# Patient Record
Sex: Male | Born: 1995 | Race: White | Hispanic: No | Marital: Single | Smoking: Never smoker
Health system: Southern US, Community
[De-identification: ages and names within clinical notes are randomized; demographics above are authoritative.]

## PROBLEM LIST (undated history)

## (undated) DIAGNOSIS — N200 Calculus of kidney: Secondary | ICD-10-CM

## (undated) DIAGNOSIS — T7840XA Allergy, unspecified, initial encounter: Secondary | ICD-10-CM

## (undated) HISTORY — DX: Allergy, unspecified, initial encounter: T78.40XA

## (undated) HISTORY — DX: Calculus of kidney: N20.0

---
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
## 2016-05-10 ENCOUNTER — Emergency Department (HOSPITAL_COMMUNITY): Payer: BLUE CROSS/BLUE SHIELD

## 2016-05-10 ENCOUNTER — Encounter (HOSPITAL_COMMUNITY): Payer: Self-pay

## 2016-05-10 ENCOUNTER — Emergency Department (HOSPITAL_COMMUNITY)
Admission: EM | Admit: 2016-05-10 | Discharge: 2016-05-10 | Disposition: A | Discharge status: Home / Self Care | Payer: BLUE CROSS/BLUE SHIELD | Attending: Emergency Medicine | Admitting: Emergency Medicine

## 2016-05-10 DIAGNOSIS — N201 Calculus of ureter: Secondary | ICD-10-CM | POA: Insufficient documentation

## 2016-05-10 DIAGNOSIS — N50811 Right testicular pain: Secondary | ICD-10-CM | POA: Diagnosis present

## 2016-05-10 LAB — URINALYSIS, ROUTINE W REFLEX MICROSCOPIC
BILIRUBIN URINE: NEGATIVE
Bacteria, UA: NONE SEEN
GLUCOSE, UA: NEGATIVE mg/dL
Ketones, ur: NEGATIVE mg/dL
Leukocytes, UA: NEGATIVE
NITRITE: NEGATIVE
PROTEIN: 30 mg/dL — AB
SPECIFIC GRAVITY, URINE: 1.027 (ref 1.005–1.030)
pH: 6 (ref 5.0–8.0)

## 2016-05-10 MED ORDER — ONDANSETRON HCL 4 MG/2ML IJ SOLN
4.0000 mg | Freq: Once | INTRAMUSCULAR | Status: AC
  Administered 2016-05-10: 4 mg via INTRAVENOUS
  Filled 2016-05-10: qty 2

## 2016-05-10 MED ORDER — FENTANYL CITRATE (PF) 100 MCG/2ML IJ SOLN
50.0000 ug | Freq: Once | INTRAMUSCULAR | Status: AC
  Administered 2016-05-10: 50 ug via INTRAVENOUS
  Filled 2016-05-10: qty 2

## 2016-05-10 MED ORDER — TAMSULOSIN HCL 0.4 MG PO CAPS: ORAL_CAPSULE | ORAL | 0 refills | Status: AC

## 2016-05-10 MED ORDER — OXYCODONE-ACETAMINOPHEN 5-325 MG PO TABS: 1.0000 | ORAL_TABLET | Freq: Four times a day (QID) | ORAL | 0 refills | Status: AC | PRN

## 2016-05-10 MED ORDER — ONDANSETRON HCL 4 MG PO TABS: 4.0000 mg | ORAL_TABLET | Freq: Three times a day (TID) | ORAL | 0 refills | Status: AC | PRN

## 2016-05-10 MED ORDER — KETOROLAC TROMETHAMINE 30 MG/ML IJ SOLN
30.0000 mg | Freq: Once | INTRAMUSCULAR | Status: AC
Start: 2016-05-10 — End: 2016-05-10
  Administered 2016-05-10: 30 mg via INTRAVENOUS
  Filled 2016-05-10: qty 1

## 2016-05-10 NOTE — ED Provider Notes (Signed)
[facility] DE[facility] Provider Note   CSN: [id number] Arrival date & time: 05/10/16  0435  Time seen 04:48 AM   History   Chief Complaint Chief Complaint  Patient presents with  . Testicle Pain    HPI Erik Phillips is a 21 y.o. male.  HPI  patient states he developed acute onset of pain in his right lower quadrant and right testicle about 2:15 this morning. The pain gets better or worse but does not go away. He states laying down makes the pain worse, standing up makes it feel better. The pain does not radiate into his back. He denies any swelling in his testicle. He denies his testicle being painful to touch. He's had no difficulty urinating. He's never had this pain before. Mother states she and her father has kidney stones. Patient has had nausea and vomiting because of the pain. Mother gave him 2 Pepto-Bismol without relief.  [facility]   History reviewed. No pertinent past medical history.  There are no active problems to display for this patient.   History reviewed. No pertinent surgical history.     Home Medications    Prior to Admission medications   Medication Sig Start Date End Date Taking? Authorizing Provider  ondansetron (ZOFRAN) 4 MG tablet Take 1 tablet (4 mg total) by mouth every 8 (eight) hours as needed for nausea or vomiting. 05/10/16   Devoria AlbeIva Livi Mcgann, MD  oxyCODONE-acetaminophen (PERCOCET/ROXICET) 5-325 MG tablet Take 1 tablet by mouth every 6 (six) hours as needed for severe pain. 05/10/16   Devoria AlbeIva Abie Killian, MD  tamsulosin (FLOMAX) 0.4 MG CAPS capsule Take 1 po QD until you pass the stone. 05/10/16   Devoria AlbeIva Latoiya Maradiaga, MD    Family History No family history on file.  Social History Social History  Substance Use Topics  . Smoking status: Never Smoker  . Smokeless tobacco: Never Used  . Alcohol use No  lives with mother  Allergies   Patient has no known allergies.   Review of Systems Review of Systems  All other systems reviewed and are  negative.    Physical Exam Updated Vital Signs BP 141/80 (BP Location: Left Arm)   Pulse 80   Temp 98.9 F (37.2 C) (Oral)   Resp 20   Ht 5\' 9"  (1.753 m)   Wt 190 lb (86.2 kg)   SpO2 99%   BMI 28.06 kg/m   Vital signs normal    Physical Exam  Constitutional: He is oriented to person, place, and time. He appears well-developed and well-nourished.  Non-toxic appearance. He does not appear ill. He appears distressed.  Pacing in room  HENT:  Head: Normocephalic and atraumatic.  Right Ear: External ear normal.  Left Ear: External ear normal.  Nose: Nose normal. No mucosal edema or rhinorrhea.  Mouth/Throat: Oropharynx is clear and moist and mucous membranes are normal. No dental abscesses or uvula swelling.  Eyes: Conjunctivae and EOM are normal. Pupils are equal, round, and reactive to light.  Neck: Normal range of motion and full passive range of motion without pain. Neck supple.  Cardiovascular: Normal rate, regular rhythm and normal heart sounds.  Exam reveals no gallop and no friction rub.   No murmur heard. Pulmonary/Chest: Effort normal and breath sounds normal. No respiratory distress. He has no wheezes. He has no rhonchi. He has no rales. He exhibits no tenderness and no crepitus.  Abdominal: Soft. Normal appearance and bowel sounds are normal. He exhibits no distension. There is tenderness in  the right lower quadrant. There is no rebound and no guarding.    Genitourinary: Penis normal.  Genitourinary Comments: Patient's testicles are normal size bilaterally. Neither testicle is tender to palpation. The scrotum does not appear enlarged. He's nontender over the epididymis. No CVA tenderness bilaterally.  Musculoskeletal: Normal range of motion. He exhibits no edema or tenderness.  Moves all extremities well.   Neurological: He is alert and oriented to person, place, and time. He has normal strength. No cranial nerve deficit.  Skin: Skin is warm, dry and intact. No rash  noted. No erythema. There is pallor.  Psychiatric: He has a normal mood and affect. His speech is normal and behavior is normal. His mood appears not anxious.  Nursing note and vitals reviewed.    ED Treatments / Results  Labs (all labs ordered are listed, but only abnormal results are displayed)  Results for orders placed or performed during the hospital encounter of 05/10/16  Urinalysis, Routine w reflex microscopic  Result Value Ref Range   Color, Urine YELLOW YELLOW   APPearance HAZY (A) CLEAR   Specific Gravity, Urine 1.027 1.005 - 1.030   pH 6.0 5.0 - 8.0   Glucose, UA NEGATIVE NEGATIVE mg/dL   Hgb urine dipstick LARGE (A) NEGATIVE   Bilirubin Urine NEGATIVE NEGATIVE   Ketones, ur NEGATIVE NEGATIVE mg/dL   Protein, ur 30 (A) NEGATIVE mg/dL   Nitrite NEGATIVE NEGATIVE   Leukocytes, UA NEGATIVE NEGATIVE   RBC / HPF TOO NUMEROUS TO COUNT 0 - 5 RBC/hpf   WBC, UA 0-5 0 - 5 WBC/hpf   Bacteria, UA NONE SEEN NONE SEEN   Mucous PRESENT      Laboratory interpretation all normal except hematuria    EKG  EKG Interpretation None       Radiology Ct Renal Stone Study  Result Date: 05/10/2016 CLINICAL DATA:  Left testicular pain.  Right lower abdominal pain. EXAM: CT ABDOMEN AND PELVIS WITHOUT CONTRAST TECHNIQUE: Multidetector CT imaging of the abdomen and pelvis was performed following the standard protocol without IV contrast. COMPARISON:  None. FINDINGS: Lower chest: No pulmonary nodules or pleural effusion. No visible pericardial effusion. Hepatobiliary: Normal noncontrast appearance of the liver. No visible biliary dilatation. Normal gallbladder. Pancreas: Normal noncontrast appearance of the pancreas. No peripancreatic fluid collection. Spleen: Normal. Adrenal glands: Normal. Urinary Tract: --Right kidney/ureter: There is mild right hydronephrosis and hydroureter. There is a 3 x 3 mm ureteral calculus at the level of the pelvic brim. There is an additional punctate renal  calculus near the lower pole the right kidney. --Left kidney/ureter: No hydronephrosis or perinephric stranding. No nephrolithiasis. No obstructing ureteral stones. --Urinary bladder: Unremarkable. Stomach/Bowel: No dilated loops of bowel. No evidence of colonic or enteric inflammation. No fluid collection within the abdomen. Vascular/Lymphatic: The abdominal aorta and other major abdominal vessels have a normal noncontrast appearance. There are multiple right lower quadrant lymph nodes, measuring up to 8 mm. Reproductive: Normal prostate and seminal vesicles. Musculoskeletal. No focal osseous lesion. Normal visualized extraperitoneal and extrathoracic soft tissues. IMPRESSION: 1. Right-sided obstructive uropathy with 3 mm stone in the right ureter at the level of the pelvic brim. Mild right hydronephrosis. 2. Nonobstructing, punctate renal calculus of the right lower pole. 3. Nonspecific right lower quadrant clustered lymph nodes. Electronically Signed   By: Deatra Robinson M.D.   On: 05/10/2016 05:37    Procedures Procedures (including critical care time)  Medications Ordered in ED Medications  fentaNYL (SUBLIMAZE) injection 50 mcg (50 mcg Intravenous Given  05/10/16 0508)  ketorolac (TORADOL) 30 MG/ML injection 30 mg (30 mg Intravenous Given 05/10/16 0508)  ondansetron (ZOFRAN) injection 4 mg (4 mg Intravenous Given 05/10/16 0509)     Initial Impression / Assessment and Plan / ED Course  I have reviewed the triage vital signs and the nursing notes.  Pertinent labs & imaging results that were available during my care of the patient were reviewed by me and considered in my medical decision making (see chart for details).   On triage nurses were initially concerned patient had torsion of his testicle. However after my exam it was thought his aches Sam was more consistent with a renal stone. The ultrasound that was initially ordered was canceled. CT scan was ordered. Patient was given IV pain medication  and nausea medication.  Recheck 06:00 AM his pain is gone, discussed his CT results. Waiting for urine for UA  UA just shows hematuria, patient remains pain free, discharged home with urology referral and pain medications.   Final Clinical Impressions(s) / ED Diagnoses   Final diagnoses:  Right ureteral stone    New Prescriptions Discharge Medication List as of 05/10/2016  7:14 AM    START taking these medications   Details  ondansetron (ZOFRAN) 4 MG tablet Take 1 tablet (4 mg total) by mouth every 8 (eight) hours as needed for nausea or vomiting., Starting Fri 05/10/2016, Print    oxyCODONE-acetaminophen (PERCOCET/ROXICET) 5-325 MG tablet Take 1 tablet by mouth every 6 (six) hours as needed for severe pain., Starting Fri 05/10/2016, Print    tamsulosin (FLOMAX) 0.4 MG CAPS capsule Take 1 po QD until you pass the stone., Print        Plan discharge  Devoria Albe, MD, Concha Pyo, MD 05/10/16 [phone]

## 2016-05-10 NOTE — ED Notes (Signed)
Pt dry heaving from pain.

## 2016-05-10 NOTE — ED Triage Notes (Signed)
Pulling type of pain to left testicle.  Pt states he awoke with the pain at 2 am.  Pt denies any urinary symptoms.

## 2016-05-10 NOTE — Discharge Instructions (Signed)
Drink plenty of fluids. Take the medications as prescribed. You can be rechecked by [facility]. Call the office to get an appointment. You have a 3 mm stone at the pelvic brim.  Return to the ED if you get a fever or have uncontrolled vomiting or pain.

## 2016-06-27 ENCOUNTER — Emergency Department (HOSPITAL_COMMUNITY)
Admission: EM | Admit: 2016-06-27 | Discharge: 2016-06-27 | Disposition: A | Discharge status: Home / Self Care | Payer: BLUE CROSS/BLUE SHIELD | Attending: Emergency Medicine | Admitting: Emergency Medicine

## 2016-06-27 ENCOUNTER — Encounter (HOSPITAL_COMMUNITY): Payer: Self-pay | Admitting: *Deleted

## 2016-06-27 ENCOUNTER — Emergency Department (HOSPITAL_COMMUNITY): Payer: BLUE CROSS/BLUE SHIELD

## 2016-06-27 DIAGNOSIS — Y9389 Activity, other specified: Secondary | ICD-10-CM | POA: Insufficient documentation

## 2016-06-27 DIAGNOSIS — S161XXA Strain of muscle, fascia and tendon at neck level, initial encounter: Secondary | ICD-10-CM

## 2016-06-27 DIAGNOSIS — S8011XA Contusion of right lower leg, initial encounter: Secondary | ICD-10-CM

## 2016-06-27 DIAGNOSIS — Y999 Unspecified external cause status: Secondary | ICD-10-CM | POA: Insufficient documentation

## 2016-06-27 DIAGNOSIS — Y92410 Unspecified street and highway as the place of occurrence of the external cause: Secondary | ICD-10-CM | POA: Insufficient documentation

## 2016-06-27 DIAGNOSIS — S199XXA Unspecified injury of neck, initial encounter: Secondary | ICD-10-CM | POA: Diagnosis present

## 2016-06-27 NOTE — Discharge Instructions (Signed)
Elevate your leg when possible and apply ice packs on/off to your leg and neck.  Ibuprofen 600 mg every 6-8 hrs as needed for pain.  Follow-up with our doctor or return here for any worsening symptoms

## 2016-06-27 NOTE — ED Notes (Signed)
Patient transported to X-ray 
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
## 2018-10-13 ENCOUNTER — Other Ambulatory Visit: Payer: BLUE CROSS/BLUE SHIELD

## 2018-10-13 ENCOUNTER — Other Ambulatory Visit: Payer: Self-pay

## 2018-10-13 DIAGNOSIS — Z20822 Contact with and (suspected) exposure to covid-19: Secondary | ICD-10-CM

## 2018-10-14 ENCOUNTER — Other Ambulatory Visit: Payer: BLUE CROSS/BLUE SHIELD

## 2018-10-15 ENCOUNTER — Telehealth: Payer: Self-pay | Admitting: General Practice

## 2018-10-15 LAB — NOVEL CORONAVIRUS, NAA: SARS-CoV-2, NAA: NOT DETECTED

## 2018-10-15 NOTE — Telephone Encounter (Signed)
Pt given Covid-19 results. Spoke with pt directly. °

## 2024-01-14 ENCOUNTER — Ambulatory Visit (INDEPENDENT_AMBULATORY_CARE_PROVIDER_SITE_OTHER): Admitting: Family Medicine

## 2024-01-14 VITALS — BP 117/89 | HR 58 | Temp 98.0°F | Ht 69.0 in | Wt 172.2 lb

## 2024-01-14 DIAGNOSIS — Z114 Encounter for screening for human immunodeficiency virus [HIV]: Secondary | ICD-10-CM

## 2024-01-14 DIAGNOSIS — Z0000 Encounter for general adult medical examination without abnormal findings: Secondary | ICD-10-CM | POA: Insufficient documentation

## 2024-01-14 DIAGNOSIS — D229 Melanocytic nevi, unspecified: Secondary | ICD-10-CM | POA: Diagnosis not present

## 2024-01-14 DIAGNOSIS — Z0001 Encounter for general adult medical examination with abnormal findings: Secondary | ICD-10-CM

## 2024-01-14 DIAGNOSIS — Z1159 Encounter for screening for other viral diseases: Secondary | ICD-10-CM

## 2024-01-14 DIAGNOSIS — Z23 Encounter for immunization: Secondary | ICD-10-CM

## 2024-01-14 NOTE — Patient Instructions (Signed)
 It was great to meet you today and I'm excited to have you join the [facility] practice. I hope you had a positive experience today! If you feel so inclined, please feel free to recommend our practice to friends and family. Jeoffrey Barrio, FNP-C

## 2024-01-14 NOTE — Assessment & Plan Note (Signed)
 Today your medical history was reviewed and routine physical exam with labs was performed. Recommend 150 minutes of moderate intensity exercise weekly and consuming a well-balanced diet. Advised to stop smoking if a smoker, avoid smoking if a non-smoker, limit alcohol consumption to 1 drink per day for women and 2 drinks per day for men, and avoid illicit drug use. Counseled on safe sex practices and offered STI testing today. Counseled on the importance of sunscreen use. Counseled in mental health awareness and when to seek medical care. Vaccine maintenance discussed. Appropriate health maintenance items reviewed. Return to office in 1 year for annual physical exam.  Assessment and Plan Assessment & Plan Adult Wellness Visit Healthy 28 year old male with a history of nephrolithiasis. Blood pressure normal. Family history of maternal hypertension and stroke. No recent blood work. - Administer influenza vaccine. - Order CBC, metabolic panel, lipid panel. - Discussed HIV and hepatitis C screenings; deferred due to insurance concerns. - Discussed HPV vaccination; deferred. - Encouraged regular exercise and balanced diet to prevent hypertension.

## 2024-01-14 NOTE — Progress Notes (Signed)
 New Patient Office Visit  Subjective    Patient ID: Erik Phillips, male    DOB: 1995-08-06  Age: 28 y.o. MRN: [id number]  CC:  Chief Complaint  Patient presents with   Establish Care    HPI Erik Phillips presents to establish care. Oriented to practice routines and expectations. No recent PCP. PMH includes kidney stones. No concerns today.   Discussed the use of AI scribe software for clinical note transcription with the patient, who gave verbal consent to proceed.  History of Present Illness Erik Phillips is a 28 year old male who presents for an annual physical exam and to establish care.  He has a history of kidney stones, which began at a young age. He reports that he has not had any kidney stones in a while and was able to pass the stones without surgical intervention.  He is concerned about a couple of moles, one on his neck and another that has changed from flat to protruding with some discoloration.  He takes over-the-counter medications including melatonin and Zyrtec, as well as vitamin C chewables, primarily during cold seasons to boost immunity.  He is a engineer, site, currently working at [employer] but will be transitioning to [employer] in two weeks.  His family history is significant for his mother having hypertension and experiencing a stroke in her forties. She is currently on multiple medications for blood pressure management.  No issues with hearing, vision, dental health, urination, bowel movements, or skin problems aside from the moles. No history of smoking, asthma, or allergies. He has not had recent blood work and is due for routine screenings. He has not received the flu vaccine this season. He is unsure about his HPV vaccination status.       Outpatient Encounter Medications as of 01/14/2024  Medication Sig   Calcium Carb-Cholecalciferol (TGT CALCIUM DIETARY SUPPLEMENT PO) Take 282 mg by  mouth daily.   cetirizine (ZYRTEC) 10 MG tablet Take 10 mg by mouth daily.   Melatonin 10 MG CHEW Chew 10 mg by mouth daily.   ondansetron  (ZOFRAN ) 4 MG tablet Take 1 tablet (4 mg total) by mouth every 8 (eight) hours as needed for nausea or vomiting. (Patient not taking: Reported on 01/14/2024)   oxyCODONE -acetaminophen  (PERCOCET/ROXICET) 5-325 MG tablet Take 1 tablet by mouth every 6 (six) hours as needed for severe pain. (Patient not taking: Reported on 01/14/2024)   tamsulosin  (FLOMAX ) 0.4 MG CAPS capsule Take 1 po QD until you pass the stone. (Patient not taking: Reported on 01/14/2024)   No facility-administered encounter medications on file as of 01/14/2024.    Past Medical History:  Diagnosis Date   Allergy    Nephrolithiasis     History reviewed. No pertinent surgical history.  History reviewed. No pertinent family history.  Social History   Socioeconomic History   Marital status: Single    Spouse name: Not on file   Number of children: Not on file   Years of education: Not on file   Highest education level: Master's degree (e.g., MA, MS, MEng, MEd, MSW, MBA)  Occupational History   Not on file  Tobacco Use   Smoking status: Never   Smokeless tobacco: Never  Vaping Use   Vaping status: Never Used  Substance and Sexual Activity   Alcohol use: No   Drug use: Never   Sexual activity: Not on file  Other Topics Concern   Not on file  Social History Narrative   Not on file   Social Drivers of Health   Financial Resource Strain: Low Risk  (01/14/2024)   Overall Financial Resource Strain (CARDIA)    Difficulty of Paying Living Expenses: Not hard at all  Food Insecurity: No Food Insecurity (01/14/2024)   Hunger Vital Sign    Worried About Running Out of Food in the Last Year: Never true    Ran Out of Food in the Last Year: Never true  Transportation Needs: No Transportation Needs (01/14/2024)   PRAPARE - Administrator, Civil Service (Medical): No     Lack of Transportation (Non-Medical): No  Physical Activity: Sufficiently Active (01/14/2024)   Exercise Vital Sign    Days of Exercise per Week: 5 days    Minutes of Exercise per Session: 90 min  Stress: No Stress Concern Present (01/14/2024)   [employer] of Occupational Health - Occupational Stress Questionnaire    Feeling of Stress: Not at all  Social Connections: Socially Integrated (01/14/2024)   Social Connection and Isolation Panel    Frequency of Communication with Friends and Family: More than three times a week    Frequency of Social Gatherings with Friends and Family: Three times a week    Attends Religious Services: More than 4 times per year    Active Member of Clubs or Organizations: Yes    Attends Engineer, Structural: More than 4 times per year    Marital Status: Married  Catering Manager Violence: Not on file    Review of Systems  Constitutional: Negative.   HENT: Negative.    Eyes: Negative.   Respiratory: Negative.    Cardiovascular: Negative.   Gastrointestinal: Negative.   Genitourinary: Negative.   Musculoskeletal: Negative.   Skin: Negative.   Neurological: Negative.   Endo/Heme/Allergies: Negative.   Psychiatric/Behavioral: Negative.    All other systems reviewed and are negative.       Objective    BP 117/89   Pulse (!) 58   Temp 98 F (36.7 C)   Ht 5' 9 (1.753 m)   Wt 172 lb 3.2 oz (78.1 kg)   SpO2 97%   BMI 25.43 kg/m   Physical Exam Vitals and nursing note reviewed.  Constitutional:      Appearance: Normal appearance. He is normal weight.  HENT:     Head: Normocephalic and atraumatic.     Right Ear: Tympanic membrane, ear canal and external ear normal.     Left Ear: Tympanic membrane, ear canal and external ear normal.     Nose: Nose normal.     Mouth/Throat:     Mouth: Mucous membranes are moist.     Pharynx: Oropharynx is clear.  Eyes:     Extraocular Movements: Extraocular movements intact.     Right eye:  Normal extraocular motion and no nystagmus.     Left eye: Normal extraocular motion and no nystagmus.     Conjunctiva/sclera: Conjunctivae normal.     Pupils: Pupils are equal, round, and reactive to light.  Cardiovascular:     Rate and Rhythm: Normal rate and regular rhythm.     Pulses: Normal pulses.     Heart sounds: Normal heart sounds.  Pulmonary:     Effort: Pulmonary effort is normal.     Breath sounds: Normal breath sounds.  Abdominal:     General: Bowel sounds are normal.     Palpations: Abdomen is soft.  Genitourinary:    Comments: Deferred using shared  decision making Musculoskeletal:        General: Normal range of motion.     Cervical back: Normal range of motion and neck supple.  Skin:    General: Skin is warm and dry.     Capillary Refill: Capillary refill takes less than 2 seconds.         Comments: nevi  Neurological:     General: No focal deficit present.     Mental Status: He is alert. Mental status is at baseline.  Psychiatric:        Mood and Affect: Mood normal.        Speech: Speech normal.        Behavior: Behavior normal.        Thought Content: Thought content normal.        Cognition and Memory: Cognition and memory normal.        Judgment: Judgment normal.         Assessment & Plan:   Problem List Items Addressed This Visit     Physical exam, annual - Primary   Today your medical history was reviewed and routine physical exam with labs was performed. Recommend 150 minutes of moderate intensity exercise weekly and consuming a well-balanced diet. Advised to stop smoking if a smoker, avoid smoking if a non-smoker, limit alcohol consumption to 1 drink per day for women and 2 drinks per day for men, and avoid illicit drug use. Counseled on safe sex practices and offered STI testing today. Counseled on the importance of sunscreen use. Counseled in mental health awareness and when to seek medical care. Vaccine maintenance discussed. Appropriate health  maintenance items reviewed. Return to office in 1 year for annual physical exam.  Assessment and Plan Assessment & Plan Adult Wellness Visit Healthy 28 year old male with a history of nephrolithiasis. Blood pressure normal. Family history of maternal hypertension and stroke. No recent blood work. - Administer influenza vaccine. - Order CBC, metabolic panel, lipid panel. - Discussed HIV and hepatitis C screenings; deferred due to insurance concerns. - Discussed HPV vaccination; deferred. - Encouraged regular exercise and balanced diet to prevent hypertension.        Relevant Orders   Ambulatory referral to Dermatology   CBC with Differential/Platelet   Comprehensive metabolic panel with GFR   Lipid panel   Change in multiple nevi   Assessment and Plan Assessment & Plan Melanocytic nevi Moles on neck and body with changes in elevation and color. No malignancy signs, biopsy considered. - Refer to dermatologist for mole evaluation. - Discussed ABCDs of skin cancer        Other Visit Diagnoses       Screening for HIV (human immunodeficiency virus)         Need for hepatitis C screening test           Return in about 1 year (around 01/13/2025) for annual physical with labs 1 week prior.   Erik GORMAN Barrio, FNP

## 2024-01-14 NOTE — Addendum Note (Signed)
 Addended by: CORINNA JESUSA SAUNDERS on: 01/14/2024 03:36 PM   Modules accepted: Orders

## 2024-01-14 NOTE — Assessment & Plan Note (Signed)
 Assessment and Plan Assessment & Plan Melanocytic nevi Moles on neck and body with changes in elevation and color. No malignancy signs, biopsy considered. - Refer to dermatologist for mole evaluation. - Discussed ABCDs of skin cancer

## 2024-01-15 ENCOUNTER — Ambulatory Visit: Payer: Self-pay | Admitting: Family Medicine

## 2024-01-15 DIAGNOSIS — Z23 Encounter for immunization: Secondary | ICD-10-CM

## 2024-01-15 LAB — COMPREHENSIVE METABOLIC PANEL WITH GFR
AG Ratio: 2.1 (calc) (ref 1.0–2.5)
ALT: 21 U/L (ref 9–46)
AST: 21 U/L (ref 10–40)
Albumin: 5.1 g/dL (ref 3.6–5.1)
Alkaline phosphatase (APISO): 62 U/L (ref 36–130)
BUN: 15 mg/dL (ref 7–25)
CO2: 31 mmol/L (ref 20–32)
Calcium: 10 mg/dL (ref 8.6–10.3)
Chloride: 102 mmol/L (ref 98–110)
Creat: 0.84 mg/dL (ref 0.60–1.24)
Globulin: 2.4 g/dL (ref 1.9–3.7)
Glucose, Bld: 93 mg/dL (ref 65–99)
Potassium: 4.7 mmol/L (ref 3.5–5.3)
Sodium: 139 mmol/L (ref 135–146)
Total Bilirubin: 0.4 mg/dL (ref 0.2–1.2)
Total Protein: 7.5 g/dL (ref 6.1–8.1)
eGFR: 123 mL/min/1.73m2 (ref >=60)

## 2024-01-15 LAB — CBC WITH DIFFERENTIAL/PLATELET
Absolute Lymphocytes: 2462 {cells}/uL (ref 850–3900)
Absolute Monocytes: 524 {cells}/uL (ref 200–950)
Basophils Absolute: 68 {cells}/uL (ref 0–200)
Basophils Relative: 1.2 %
Eosinophils Absolute: 222 {cells}/uL (ref 15–500)
Eosinophils Relative: 3.9 %
HCT: 44.2 % (ref 38.5–50.0)
Hemoglobin: 15 g/dL (ref 13.2–17.1)
MCH: 33 pg (ref 27.0–33.0)
MCHC: 33.9 g/dL (ref 32.0–36.0)
MCV: 97.1 fL (ref 80.0–100.0)
MPV: 10.7 fL (ref 7.5–12.5)
Monocytes Relative: 9.2 %
Neutro Abs: 2423 {cells}/uL (ref 1500–7800)
Neutrophils Relative %: 42.5 %
Platelets: 355 Thousand/uL (ref 140–400)
RBC: 4.55 Million/uL (ref 4.20–5.80)
RDW: 11.7 % (ref 11.0–15.0)
Total Lymphocyte: 43.2 %
WBC: 5.7 Thousand/uL (ref 3.8–10.8)

## 2024-01-15 LAB — LIPID PANEL
Cholesterol: 186 mg/dL (ref <200)
HDL: 62 mg/dL (ref >=40)
LDL Cholesterol (Calc): 110 mg/dL — ABNORMAL HIGH
Non-HDL Cholesterol (Calc): 124 mg/dL (ref <130)
Total CHOL/HDL Ratio: 3 (calc) (ref <5.0)
Triglycerides: 59 mg/dL (ref <150)

## 2024-03-25 ENCOUNTER — Encounter: Payer: Self-pay | Admitting: Dermatology

## 2024-03-25 ENCOUNTER — Ambulatory Visit: Admitting: Dermatology

## 2024-03-25 DIAGNOSIS — L814 Other melanin hyperpigmentation: Secondary | ICD-10-CM

## 2024-03-25 DIAGNOSIS — D1801 Hemangioma of skin and subcutaneous tissue: Secondary | ICD-10-CM

## 2024-03-25 DIAGNOSIS — L578 Other skin changes due to chronic exposure to nonionizing radiation: Secondary | ICD-10-CM

## 2024-03-25 DIAGNOSIS — D489 Neoplasm of uncertain behavior, unspecified: Secondary | ICD-10-CM

## 2024-03-25 DIAGNOSIS — L821 Other seborrheic keratosis: Secondary | ICD-10-CM

## 2024-03-25 DIAGNOSIS — Z1283 Encounter for screening for malignant neoplasm of skin: Secondary | ICD-10-CM

## 2024-03-25 DIAGNOSIS — D229 Melanocytic nevi, unspecified: Secondary | ICD-10-CM

## 2024-03-25 NOTE — Patient Instructions (Addendum)

## 2024-03-25 NOTE — Progress Notes (Addendum)
 "  New Patient Visit   Patient (and/or pt guardian) consented to the use of AI-assisted tools for note generation.  Subjective  Erik Phillips is a 29 y.o. male who presents for the following:  Total Body Skin Exam (TBSE)  Patient present today for new patient visit for TBSE. The patient reports he has spots, moles and lesions to be evaluated, some may be new or changing and the patient may have concern these could be cancer.  Patient has not previously been treated by a dermatologist. Patient reports he does not have hx of bx.  Patient denies family history of skin cancers.  Patient reports throughout his lifetime has had moderate sun exposure. Currently, patient reports if he  has excessive sun exposure, he does apply sunscreen and/or wears protective coverings.  Patient would also like to discuss Acne  The following portions of the chart were reviewed this encounter and updated as appropriate: medications, allergies, medical history  Review of Systems:  No other skin or systemic complaints except as noted in HPI or Assessment and Plan.  Objective  Well appearing patient in no apparent distress; mood and affect are within normal limits.  A full examination was performed including scalp, head, eyes, ears, nose, lips, neck, chest, axillae, abdomen, back, buttocks, bilateral upper extremities, bilateral lower extremities, hands, feet, fingers, toes, fingernails, and toenails. All findings within normal limits unless otherwise noted below.                        Relevant exam findings are noted in the Assessment and Plan.  Left upper arm 4 mm irregular brown macule  Chest - Medial (Center) 1 cm irregular papule  Right Posterier Shoulder  Left Inguinal Crease   Assessment & Plan   LENTIGINES, SEBORRHEIC KERATOSES, HEMANGIOMAS - Benign normal skin lesions - Benign-appearing - Call for any changes  MELANOCYTIC NEVI - Tan-brown and/or pink-flesh-colored symmetric  macules and papules - Benign appearing on exam today - Observation - Call clinic for new or changing moles - Recommend daily use of broad spectrum spf 30+ sunscreen to sun-exposed areas.   ACTINIC DAMAGE - Chronic condition, secondary to cumulative UV/sun exposure - diffuse scaly erythematous macules with underlying dyspigmentation - Recommend daily broad spectrum sunscreen SPF 30+ to sun-exposed areas, reapply every 2 hours as needed.  - Staying in the shade or wearing long sleeves, sun glasses (UVA+UVB protection) and wide brim hats (4-inch brim around the entire circumference of the hat) are also recommended for sun protection.  - Call for new or changing lesions.  SKIN CANCER SCREENING PERFORMED TODAY  NEOPLASM OF UNCERTAIN BEHAVIOR (4) Left upper arm - Epidermal / dermal shaving  Lesion diameter (cm):  0.4 Informed consent: discussed and consent obtained   Timeout: patient name, date of birth, surgical site, and procedure verified   Procedure prep:  Patient was prepped and draped in usual sterile fashion Prep type:  Isopropyl alcohol Anesthesia: the lesion was anesthetized in a standard fashion   Anesthetic:  1% lidocaine w/ epinephrine 1-100,000 local infiltration Instrument used: DermaBlade   Hemostasis achieved with: aluminum chloride   Outcome: patient tolerated procedure well   Post-procedure details: sterile dressing applied and wound care instructions given   Dressing type: bandage and petrolatum    Specimen A - Surgical pathology Differential Diagnosis: r/o DN  Check Margins: No Chest - Medial (Center) - Epidermal / dermal shaving  Lesion diameter (cm):  0.1 Informed consent: discussed and consent obtained  Timeout: patient name, date of birth, surgical site, and procedure verified   Procedure prep:  Patient was prepped and draped in usual sterile fashion Prep type:  Isopropyl alcohol Anesthesia: the lesion was anesthetized in a standard fashion    Anesthetic:  1% lidocaine w/ epinephrine 1-100,000 local infiltration Instrument used: DermaBlade   Hemostasis achieved with: aluminum chloride   Outcome: patient tolerated procedure well   Post-procedure details: sterile dressing applied and wound care instructions given   Dressing type: bandage and petrolatum    Specimen B - Surgical pathology Differential Diagnosis: r/o DN  Check Margins: No Right Posterier Shoulder - Epidermal / dermal shaving  Lesion diameter (cm):  0.6 Informed consent: discussed and consent obtained   Timeout: patient name, date of birth, surgical site, and procedure verified   Procedure prep:  Patient was prepped and draped in usual sterile fashion Prep type:  Isopropyl alcohol Anesthesia: the lesion was anesthetized in a standard fashion   Anesthetic:  1% lidocaine w/ epinephrine 1-100,000 local infiltration Instrument used: DermaBlade   Hemostasis achieved with: aluminum chloride   Outcome: patient tolerated procedure well   Post-procedure details: sterile dressing applied and wound care instructions given   Dressing type: bandage and petrolatum    Specimen C - Surgical pathology Differential Diagnosis: r/o DN  Check Margins: No Left Inguinal Crease - Epidermal / dermal shaving  Lesion diameter (cm):  0.1 Informed consent: discussed and consent obtained   Timeout: patient name, date of birth, surgical site, and procedure verified   Procedure prep:  Patient was prepped and draped in usual sterile fashion Prep type:  Isopropyl alcohol Anesthesia: the lesion was anesthetized in a standard fashion   Anesthetic:  1% lidocaine w/ epinephrine 1-100,000 local infiltration Instrument used: DermaBlade   Outcome: patient tolerated procedure well   Post-procedure details: wound care instructions given    Specimen D - Surgical pathology Differential Diagnosis: r/o DN  Check Margins: No SKIN EXAM FOR MALIGNANT NEOPLASM   MULTIPLE BENIGN MELANOCYTIC  NEVI   CHERRY ANGIOMA   LENTIGINES   SEBORRHEIC KERATOSIS   ACTINIC SKIN DAMAGE    Return in 1 year (on 03/25/2025) for TBSE f/u.  LILLETTE [name], am acting as a neurosurgeon for [employer], DO .  Documentation: I have reviewed the above documentation for accuracy and completeness, and I agree with the above.  Delon Lenis, DO   "

## 2024-03-26 LAB — SURGICAL PATHOLOGY

## 2024-03-30 ENCOUNTER — Ambulatory Visit: Payer: Self-pay | Admitting: Dermatology

## 2024-03-30 DIAGNOSIS — D239 Other benign neoplasm of skin, unspecified: Secondary | ICD-10-CM | POA: Insufficient documentation

## 2024-03-30 NOTE — Progress Notes (Signed)
 Hi Shirron, Please call pt and notify that their bx results showed an abnormal mole that requires a full excision in office with Dr Corey   SURGICAL PATHOLOGY [facility] [address], [address] [place], [place] [address] Telephone [phone] or [phone] Fax [phone]  REPORT OF DERMATOPATHOLOGY   Accession #: [id number] Patient Name: Erik Phillips, Erik Phillips Visit # : [id number]  MRN: [id number] Cytotechnologist: Santa Md, Eleanor, Dermatopathologist, Electronic Signature DOB/Age 23-Mar-1995 (Age: 29) Gender: M Collected Date: 03/25/2024 Received Date: 03/25/2024  FINAL DIAGNOSIS       1. Skin, left upper arm : No treatment      DYSPLASTIC JUNCTIONAL NEVUS WITH MODERATE ATYPIA, INFLAMED, CLOSE TO MARGIN       2. Skin, chest - medial (center) :  SE w/ Dr SHAUNNA      DYSPLASTIC NEVUS WITH MODERATE TO SEVERE ATYPIA, INFLAMED, CLOSE TO MARGIN, SEE      DESCRIPTION       3. Skin, right posterior shoulder : SE w/ Dr SHAUNNA      JUNCTIONAL DYSPLASTIC MELANOCYTIC NEVUS WITH MODERATE TO SEVERE ATYPIA, CLOSE TO      MARGIN, SEE DESCRIPTION       4. Skin, left inguinal crease : No treatment      MELANOCYTIC NEVUS, COMPOUND TYPE, BASE INVOLVED

## 2024-07-01 ENCOUNTER — Encounter: Admitting: Dermatology

## 2024-07-15 ENCOUNTER — Encounter: Admitting: Dermatology

## 2025-01-14 ENCOUNTER — Other Ambulatory Visit

## 2025-01-17 ENCOUNTER — Encounter: Admitting: Family Medicine
